# Patient Record
Sex: Female | Born: 1970 | Race: White | Hispanic: No | Marital: Married
Health system: Southern US, Community
[De-identification: ages and names within clinical notes are randomized; demographics above are authoritative.]

## PROBLEM LIST (undated history)

## (undated) DIAGNOSIS — E7800 Pure hypercholesterolemia, unspecified: Secondary | ICD-10-CM

## (undated) DIAGNOSIS — M5126 Other intervertebral disc displacement, lumbar region: Secondary | ICD-10-CM

## (undated) DIAGNOSIS — F419 Anxiety disorder, unspecified: Secondary | ICD-10-CM

## (undated) DIAGNOSIS — K219 Gastro-esophageal reflux disease without esophagitis: Secondary | ICD-10-CM

## (undated) HISTORY — DX: Gastro-esophageal reflux disease without esophagitis: K21.9

## (undated) HISTORY — PX: ABLATION: SHX5711

## (undated) HISTORY — DX: Other intervertebral disc displacement, lumbar region: M51.26

---

## 1998-02-18 ENCOUNTER — Encounter: Admission: RE | Admit: 1998-02-18 | Discharge: 1998-05-19 | Payer: Self-pay | Admitting: *Deleted

## 1999-07-03 ENCOUNTER — Encounter: Payer: Self-pay | Admitting: Obstetrics and Gynecology

## 1999-07-03 ENCOUNTER — Ambulatory Visit (HOSPITAL_COMMUNITY): Admission: RE | Admit: 1999-07-03 | Discharge: 1999-07-03 | Payer: Self-pay | Admitting: Obstetrics and Gynecology

## 1999-07-30 ENCOUNTER — Other Ambulatory Visit: Admission: RE | Admit: 1999-07-30 | Discharge: 1999-07-30 | Payer: Self-pay | Admitting: *Deleted

## 1999-10-01 ENCOUNTER — Encounter (INDEPENDENT_AMBULATORY_CARE_PROVIDER_SITE_OTHER): Payer: Self-pay | Admitting: Specialist

## 1999-10-01 ENCOUNTER — Other Ambulatory Visit: Admission: RE | Admit: 1999-10-01 | Discharge: 1999-10-01 | Payer: Self-pay | Admitting: *Deleted

## 1999-12-14 ENCOUNTER — Other Ambulatory Visit: Admission: RE | Admit: 1999-12-14 | Discharge: 1999-12-14 | Payer: Self-pay | Admitting: *Deleted

## 2000-03-05 ENCOUNTER — Inpatient Hospital Stay (HOSPITAL_COMMUNITY): Admission: AD | Admit: 2000-03-05 | Discharge: 2000-03-08 | Payer: Self-pay | Admitting: Obstetrics & Gynecology

## 2000-03-09 ENCOUNTER — Encounter: Admission: RE | Admit: 2000-03-09 | Discharge: 2000-03-25 | Payer: Self-pay | Admitting: *Deleted

## 2000-07-28 ENCOUNTER — Other Ambulatory Visit: Admission: RE | Admit: 2000-07-28 | Discharge: 2000-07-28 | Payer: Self-pay | Admitting: Obstetrics and Gynecology

## 2000-10-26 ENCOUNTER — Encounter (INDEPENDENT_AMBULATORY_CARE_PROVIDER_SITE_OTHER): Payer: Self-pay

## 2000-10-26 ENCOUNTER — Other Ambulatory Visit: Admission: RE | Admit: 2000-10-26 | Discharge: 2000-10-26 | Payer: Self-pay | Admitting: Obstetrics and Gynecology

## 2001-04-14 ENCOUNTER — Other Ambulatory Visit: Admission: RE | Admit: 2001-04-14 | Discharge: 2001-04-14 | Payer: Self-pay | Admitting: Otolaryngology

## 2001-04-14 ENCOUNTER — Encounter (INDEPENDENT_AMBULATORY_CARE_PROVIDER_SITE_OTHER): Payer: Self-pay | Admitting: Specialist

## 2001-12-11 ENCOUNTER — Other Ambulatory Visit: Admission: RE | Admit: 2001-12-11 | Discharge: 2001-12-11 | Payer: Self-pay | Admitting: Obstetrics and Gynecology

## 2003-05-08 ENCOUNTER — Other Ambulatory Visit: Admission: RE | Admit: 2003-05-08 | Discharge: 2003-05-08 | Payer: Self-pay | Admitting: Obstetrics and Gynecology

## 2003-05-10 ENCOUNTER — Encounter: Payer: Self-pay | Admitting: Obstetrics and Gynecology

## 2003-05-10 ENCOUNTER — Encounter: Admission: RE | Admit: 2003-05-10 | Discharge: 2003-05-10 | Payer: Self-pay | Admitting: Obstetrics and Gynecology

## 2003-10-22 ENCOUNTER — Encounter: Admission: RE | Admit: 2003-10-22 | Discharge: 2003-10-22 | Payer: Self-pay | Admitting: Internal Medicine

## 2004-04-09 ENCOUNTER — Inpatient Hospital Stay (HOSPITAL_COMMUNITY): Admission: AD | Admit: 2004-04-09 | Discharge: 2004-04-09 | Payer: Self-pay | Admitting: Obstetrics and Gynecology

## 2004-05-04 ENCOUNTER — Ambulatory Visit (HOSPITAL_COMMUNITY): Admission: RE | Admit: 2004-05-04 | Discharge: 2004-05-04 | Payer: Self-pay | Admitting: Obstetrics and Gynecology

## 2004-06-18 ENCOUNTER — Ambulatory Visit (HOSPITAL_COMMUNITY): Admission: RE | Admit: 2004-06-18 | Discharge: 2004-06-18 | Payer: Self-pay | Admitting: Obstetrics and Gynecology

## 2004-06-18 ENCOUNTER — Encounter (INDEPENDENT_AMBULATORY_CARE_PROVIDER_SITE_OTHER): Payer: Self-pay | Admitting: Specialist

## 2004-09-18 ENCOUNTER — Other Ambulatory Visit: Admission: RE | Admit: 2004-09-18 | Discharge: 2004-09-18 | Payer: Self-pay | Admitting: Obstetrics and Gynecology

## 2005-09-24 ENCOUNTER — Other Ambulatory Visit: Admission: RE | Admit: 2005-09-24 | Discharge: 2005-09-24 | Payer: Self-pay | Admitting: Obstetrics and Gynecology

## 2006-04-05 ENCOUNTER — Encounter (INDEPENDENT_AMBULATORY_CARE_PROVIDER_SITE_OTHER): Payer: Self-pay | Admitting: *Deleted

## 2006-04-05 ENCOUNTER — Inpatient Hospital Stay (HOSPITAL_COMMUNITY): Admission: RE | Admit: 2006-04-05 | Discharge: 2006-04-08 | Payer: Self-pay | Admitting: Obstetrics and Gynecology

## 2010-06-16 ENCOUNTER — Emergency Department (HOSPITAL_COMMUNITY): Admission: EM | Admit: 2010-06-16 | Discharge: 2010-06-16 | Payer: Self-pay | Admitting: Emergency Medicine

## 2010-09-08 ENCOUNTER — Ambulatory Visit (HOSPITAL_COMMUNITY)
Admission: RE | Admit: 2010-09-08 | Discharge: 2010-09-08 | Payer: Self-pay | Source: Home / Self Care | Attending: Obstetrics and Gynecology | Admitting: Obstetrics and Gynecology

## 2010-11-23 LAB — CBC
HCT: 37.1 % (ref 36.0–46.0)
MCH: 27.4 pg (ref 26.0–34.0)
MCHC: 32.1 g/dL (ref 30.0–36.0)
MCV: 85.3 fL (ref 78.0–100.0)
Platelets: 230 10*3/uL (ref 150–400)
RDW: 14.8 % (ref 11.5–15.5)

## 2010-11-23 LAB — PREGNANCY, URINE: Preg Test, Ur: NEGATIVE

## 2010-11-23 LAB — SURGICAL PCR SCREEN: MRSA, PCR: NEGATIVE

## 2011-01-29 NOTE — Op Note (Signed)
NAME:  Carol Pacheco, Carol Pacheco                 ACCOUNT NO.:  [id number]   MEDICAL RECORD NO.:  [id number]          PATIENT TYPE:  INP   LOCATION:  9141                          FACILITY:  [facility]   PHYSICIAN:  Zenaida Niece, M.D.DATE OF BIRTH:  [birth year]   DATE OF PROCEDURE:  04/05/2006  DATE OF DISCHARGE:                                 OPERATIVE REPORT   PREOPERATIVE DIAGNOSES:  1.  Intrauterine pregnancy at 39 weeks.  2.  Previous cesarean section.  3.  Desires surgical sterility.   POSTOPERATIVE DIAGNOSES:  1.  Intrauterine pregnancy at 39 weeks.  2.  Previous cesarean section.  3.  Desires surgical sterility.   PROCEDURES:  1.  Repeat low transverse cesarean section.  2.  Left partial salpingectomy.   SURGEON:  Zenaida Niece, MD.   ASSISTANT:  Tracey Harries, MD.   ANESTHESIA:  Spinal.   SPECIMENS:  Portion of left fallopian tube pathology.   ESTIMATED BLOOD LOSS:  800 mL.   COMPLICATIONS:  None.   FINDINGS:  The patient had normal anatomy with absence of right tube and  ovary and delivered a viable female infant with Apgars of 9 and 9 and weight 8  pounds 6 ounces.   PROCEDURE IN DETAIL:  The patient was taken to the operating room and placed  in the sitting position.  Dr. Tacy Dura instilled spinal anesthesia and she was  placed in the dorsal supine position with a left lateral tilt.  Abdomen was  then prepped and draped in the usual sterile fashion and a Foley catheter  was inserted.  The level of her anesthesia was found to be adequate and  abdomen was entered via her previous Pfannenstiel scar.  Once the peritoneal  cavity was entered, the peritoneum was stretched and a disposable self-  retaining retractor was placed.  A 4 cm transverse incision was then made in  the lower uterine segment.  Clear amniotic fluid was noted and the incision  was extended bilaterally digitally.  The fetal vertex was grasped and  delivered through the incision atraumatically.  A loose  nuchal cord x1  was  reduced.  The mouth and nares were suctioned.  The remainder of the infant  then delivered atraumatically.  The cord was doubly clamped and cut and the  infant handed to the awaiting pediatric team.  Cord blood was obtained and  the placenta delivered spontaneously and was sent for cord blood collection.  The uterus was wiped dry with a clean lap pad.  Uterine incision was  inspected and found to be free of extensions.  The cervix was dilated with a  long ring forceps through the uterus.  Uterine incision was repaired in one  layer, being a running locking layer with #1 chromic with adequate  hemostasis.  Bleeding from serosal edges was controlled with electrocautery.   Attention was turned to the left fallopian tube.  This was brought into the  operative field and the midportion of the tube was grasped with a Babcock  clamp.  A window was made in an avascular portion of the mesosalpinx  with  electrocautery.  A 0 plain gut suture was passed through this window and  tied proximally and then passed around distally to create a knuckle of tube  and retied.  The knuckle of tube was then removed sharply.  Both tubal ostia  were identified and the stump was hemostatic.  The left ovary was normal.  The right side was inspected and as previously noted, the right tube and  ovary were surgically absent.  The uterine incision was again inspected and  found to be hemostatic.  The subfascial space was then irrigated and made  hemostatic with electrocautery.  Rectus muscles were closed with running #1  chromic due to a fairly wide diastasis.  The fascia was then closed in a  running fashion starting at both ends and meeting in the middle with 0  Vicryl.  Subcutaneous tissue was then irrigated and made hemostatic with  electrocautery.  Skin was closed with staples, followed by a sterile  dressing.  The patient tolerated the procedure well and was taken to the  recovery room in stable  condition.  Counts were correct, and she received  Ancef 1 g after cord clamp.      Zenaida Niece, M.D.  Electronically Signed     TDM/MEDQ  D:  04/05/2006  T:  04/05/2006  Job:  [id number]

## 2011-01-29 NOTE — H&P (Signed)
NAME:  Carol Pacheco, Carol Pacheco                 ACCOUNT NO.:  [id number]   MEDICAL RECORD NO.:  [id number]          PATIENT TYPE:  AMB   LOCATION:  SDC                           [facility]ILITY:  [facility]   PHYSICIAN:  Zenaida Niece, M.D.DATE OF BIRTH:  28-Mar-1971   DATE OF ADMISSION:  06/18/2004  DATE OF DISCHARGE:                                HISTORY & PHYSICAL   CHIEF COMPLAINT:  Pelvic pain and right adnexal mass.   HISTORY OF PRESENT ILLNESS:  This is a 40 year old white female, gravida 2,  para 1-0-1-1 who was recently treated with methotrexate for an ectopic  pregnancy and had negative HCG on September 15.  However, on ultrasound exam  during the pregnancy she was found to have a right adnexal mass.  Ultrasound  here in the office on September 15 reveals an oblong cystic area of 5.3 x  2.3 x 3.6 cm which I feel is possibly a dilated right fallopian tube.  Due  to persistent pain and this persistent mass, the patient is being admitted  for laparoscopy and removal of this mass.   PAST OB HISTORY:  Cesarean section at 41 weeks for breech presentation, baby  weighed 8 pounds 6 ounces as well as the recent ectopic pregnancy.   PAST MEDICAL HISTORY:  Essentially negative.   PAST SURGICAL HISTORY:  Cesarean section.  She has had sinus surgery.   ALLERGIES:  PENICILLIN.   CURRENT MEDICATIONS:  Flonase and Claritin.   SOCIAL HISTORY:  The patient is married and denies significant alcohol,  tobacco or drug use.   REVIEW OF SYMPTOMS:  Otherwise negative.   FAMILY HISTORY:  Maternal grandmother with breast cancer.   PHYSICAL EXAMINATION:  GENERAL:  This is a well-developed, well-nourished,  white female in no acute distress.  NECK:  Supple without lymphadenopathy or thyromegaly.  LUNGS:  Clear to auscultation.  HEART:  Regular rate and rhythm without murmur.  ABDOMEN:  Soft, nontender, nondistended without palpable masses and she does  have a well healed transverse scar.  EXTREMITIES:  Have no  edema and are nontender.  PELVIC:  Uterus is normal size and she does have some fullness on the right  side and is slightly tender. The left side is normal.   ASSESSMENT:  Right adnexal mass. By ultrasound, this appears to be a  hydrosalpinx. This has persisted despite recent successful treatment for an  ectopic pregnancy. All options have been discussed with the patient and she  wishes to proceed with surgical evaluation and removal of this mass. The  risks of surgery including bleeding, infection and damage to the surrounding  organs have been discussed with the patient and she understands.   PLAN:  Admit the for a laparoscopy with possible removal of her right tube,  possible right salpingo-oophorectomy and possible ovarian cystectomy. This  all depends on what this mass is on direct examination. She also understands  there is a small possibility of requiring a laparotomy.     Todd   TDM/MEDQ  D:  06/17/2004  T:  06/17/2004  Job:  [id number]

## 2011-01-29 NOTE — Op Note (Signed)
[facility]  Patient:    Carol Pacheco, Carol Pacheco                        MRN: [id number] Proc. Date: 03/05/00 Adm. Date:  [id number] Attending:  Cleatrice Burke                           Operative Report  PREOPERATIVE DIAGNOSIS:       Breech.  Spontaneous rupture of membranes. Meconium stained fluid.  POSTOPERATIVE DIAGNOSIS:      Breech.  Spontaneous rupture of membranes. Meconium stained fluid.  Nuchal cord.  OPERATION:                    Low transverse cesarean section.  SURGEON:                      Cecilio Asper, M.D.  ASSISTANT:                    Erin Sons, C.N.M.  ANESTHESIA:                   Epidural.  ESTIMATED BLOOD LOSS:         600 cc.  URINE OUTPUT:                 150 cc.  FINDINGS:                     A viable female infant named Denny Peon, Apgars 9 and 9,  weighing 8 pounds 6 ounces.  Normal tubes and ovaries.  COMPLICATIONS:                None.  INDICATIONS:                  The patient is a 40 year old, gravida 1, para 0, ho presented at 41-2/7 weeks estimated gestational age with spontaneous rupture of  membranes at approximately 2 a.m.  The patient reported fetal movement.  She denied vaginal bleeding.  Her cervical examination was 2 to 3 cm, 90% effaced, with a bulging bag of water, and bloody show.  The patient was noted to be positive Group B Strep.  It was felt that the presenting part was too high in the pelvis to release the rest of the amniotic fluid.  At approximately 3:40 the membranes were needled.  Breech presentation was then confirmed by manual palpation as well as  ultrasound.  The patient was therefore consented for cesarean delivery.  DESCRIPTION OF PROCEDURE:     After an adequate level of epidural anesthesia was obtained, the patient was prepped and draped in a sterile fashion.  A Foley had  previously been placed in the bladder to drain it of urine.  A low transverse incision was made  down through the subcutaneous fat to the fascia.  The fascia as incised on either side of the midline and this incision was carried laterally in both directions with the Mayo scissors.  Kocher clamps were placed in the superior edge of the incision and the fascia was removed from the rectus muscles both superiorly and inferiorly.  The parietoperitoneum was grasped with hemostats and incised with the Metzenbaum scissors.  This incision was carried superiorly and  inferiorly taking care not to damage bowel or bladder.  Balfour bladder retractor was placed inside of the incision.  The visceroperitoneum of  the lower uterine segment was elevated and incised in order to develop a bladder flap.  The bladder piece was placed inside of this flap.  The uterus was entered in a low transverse cesarean fashion and extended laterally in both directions with the bandage scissors.  The breech was elevated and delivered using Panard maneuver and standard breech extraction technique.  There was a nuchal cord x 2.  The infant was DeLee suctioned and bulb suctioned on the operative field.  The cord was doubly clamped and cut and the infant was taken to the warmer where he was cared for by the pediatric team.  Cord blood was obtained.  The placenta was manually extracted.  The uterus was wiped clean with a wet lap sponge.  The uterine incision was grasped with ring clamps.  It was reapproximated with a running suture of #0 Vicryl.  A  second imbricating layer was used with good hemostatic result.  The pelvis was copiously irrigated.  The tubes and ovaries were noted to be normal.  The peritoneum was then reapproximated manually.  The fascia was reapproximated from the lateral edge to the midline with a running suture of #1 Vicryl.  The skin was closed with staples.  Bandage was applied.  Sponge, needle, and instrument counts were correct x 2.  The patient tolerated the procedure well.  She was  taken to he recovery room in stable condition. DD:  03/05/00 TD:  03/07/00 Job: [id number]

## 2011-01-29 NOTE — H&P (Signed)
NAME:  Carol Pacheco, GINTZ                 ACCOUNT NO.:  [id number]   MEDICAL RECORD NO.:  [id number]          PATIENT TYPE:  INP   LOCATION:  NA                            FACILITY:  [facility]   PHYSICIAN:  Zenaida Niece, M.D.DATE OF BIRTH:  [birth year]   DATE OF ADMISSION:  04/05/2006  DATE OF DISCHARGE:                                HISTORY & PHYSICAL   CHIEF COMPLAINT:  Repeat cesarean section.   HISTORY OF PRESENT ILLNESS:  This is a 40 year old white female gravida 5,  para 1-0-3-1, with an EGA of [redacted] weeks by an LMP consistent with an 8 week  ultrasound with a due date of April 12, 2006, who presents for repeat  cesarean section. Prenatal care complicated by the fact that she has had a  prior cesarean section but declines trial of labor and wishes to undergo  repeat cesarean section. Prenatal care also complicated by the fact that she  was treated with Zithromax for presumed sinusitis at approximately 22 weeks.  She was treated with pain medicine and muscle relaxers at 24 weeks for right  shoulder pain and also got massage for this. She has had a previous removal  of her right fallopian tube and ovary for ectopic pregnancy with persistent  adnexal mass and wishes to undergo ligation of the left tube for permanent  sterility. Prenatal care was otherwise uncomplicated.   PRENATAL LABORATORY DATA:  Blood type is A positive with a negative antibody  screen. RPR non-reactive, Rubella immune, hepatitis B surface antigen  negative. Gonorrhea and Chlamydia was negative. Triple screen is declined.  One hour Glucola is 120 and group B strep is negative.   PAST OBSTETRIC HISTORY:  In 2001, she had a cesarean section for a breech  presentation. The baby weighed 8 pounds 6 ounces. In 2005, she had a  laparoscopic right salpingectomy for an ectopic pregnancy. In 2005 and 2006,  she had spontaneous abortions.   PAST GYNECOLOGIC HISTORY:  History of condylomata and a history of abnormal  pap smears  with most recent pap smears being negative.   PAST MEDICAL HISTORY:  Significant for history of depression.   PAST SURGICAL HISTORY:  Cesarean section and laparoscopy with right  salpingooophorectomy. She has also had nose surgery.   ALLERGIES:  PENICILLIN (causes a rash.)   CURRENT MEDICATIONS:  Darvocet and Flexeril p.r.n.   SOCIAL HISTORY:  The patient is married and denies alcohol, tobacco, or drug  use.   FAMILY HISTORY:  Noncontributory.   PHYSICAL EXAMINATION:  VITAL SIGNS:  Weight 173 pounds. Blood pressure  110/70.  GENERAL:  This is a gravid female in no acute distress.  NECK:  Supple without lymphadenopathy or thyromegaly.  LUNGS:  Clear to auscultation.  HEART:  Regular rate and rhythm. Without murmur.  ABDOMEN:  Gravid, nontender with a fundal height of 39.5 cm and she has a  well healed transverse scar.  EXTREMITIES:  Have 1+ edema and are non-tender.  GENITOURINARY:  Cervix is 130, minus 3, and vertex presentation.   ASSESSMENT:  1.  Intrauterine pregnancy at 39 weeks.  2.  Previous cesarean section. The patient declines trial of labor and      wishes to undergo repeat c-section. All risks of surgery have been      discussed and she understands.  3.  Desires surgical sterility. The patient has a prior right salpingectomy      for an adnexal mass after an ectopic pregnancy and wishes to undergo      ligation of her left fallopian tube. She understands this will result in      permanent sterility with an approximately 1 in 150 failure rate.   PLAN:  Admit the patient on the day of surgery for repeat cesarean section  and ligation of her left fallopian tube.      Zenaida Niece, M.D.  Electronically Signed     TDM/MEDQ  D:  04/04/2006  T:  04/04/2006  Job:  [id number]

## 2011-01-29 NOTE — Op Note (Signed)
NAME:  Carol Pacheco, Carol Pacheco                 ACCOUNT NO.:  [id number]   MEDICAL RECORD NO.:  [id number]          PATIENT TYPE:  AMB   LOCATION:  SDC                           [facility]ACILITY:  [facility]   PHYSICIAN:  Zenaida Niece, M.D.DATE OF BIRTH:  Nov 10, 1970   DATE OF PROCEDURE:  06/18/2004  DATE OF DISCHARGE:                                 OPERATIVE REPORT   PREOPERATIVE DIAGNOSIS:  Right adnexal mass.   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   PROCEDURE:  Laparoscopic right salpingo-oophorectomy.   SURGEON:  Zenaida Niece, M.D.   ANESTHESIA:  General endotracheal tube.   ESTIMATED BLOOD LOSS:  50 cc.   FINDINGS:  She had an enlarged right ovary with a probable right  endometrioma, and this was adherent to the pelvic sidewall and the sigmoid  colon.  The right tube was clubbed and adherent to the ovary.  There  appeared to be diffuse brown endometrial implants over most of the pelvic  peritoneum.  She had a normal appendix.  The uterus was otherwise normal,  and she had a normal left tube and ovary.   DESCRIPTION OF PROCEDURE:  The patient was taken to the operating room and  placed in the dorsal supine position.  General anesthesia was induced, and  she was placed in mobile stirrups.  The abdomen was prepped and draped in  the usual sterile fashion.  A Foley catheter was placed.  A Hulka tenaculum  was applied to the cervix for uterine manipulation.  The infraumbilical skin  was then infiltrated with 0.25% Marcaine.   A 2-cm horizontal incision was made.  The Veress needle was inserted into  the peritoneal cavity, and placement was confirmed by the water drop test  and an opening pressure of 2 mmHg.  CO2 gas was then insufflated to a  pressure of 12 mmHg, and the Veress needle was removed.  The 10/11  disposable trocar was then introduced and placement confirmed by the  laparoscope.  A 5-mm port was placed low in the midline under direct  visualization.  Inspection revealed the above-mentioned findings.  Some of  the adhesions of the ovary to the pelvic sidewall were taken down with blunt  dissection.  Due to the significantly enlarged ovary and adhesions and what  appeared to be a nonfunctional tube, I elected to proceed with right  salpingo-oophorectomy.  Using bipolar cautery, the right infundibulopelvic  ligament was coagulated and then cut.  The mesosalpinx and proximal  fallopian tube were then also coagulated with bipolar cautery and cut.  This  achieved mobility for the superior portion of this mass.  The remainder of  the ovary was able to be dissected off of the pelvic sidewall and off the  colon with blunt dissection.  Some bleeding from the uterine side of the  utero-ovarian ligament was coagulated with bipolar cautery to achieve  hemostasis.  The area was copiously irrigated and found to be hemostatic.  Again, there appeared to be diffuse brown peritoneal implants consistent  with endometriosis.  This was too diffuse to warrant  fulguration.  The left  tube and ovary appeared essentially normal.  The 5-mm scope was then  inserted through the 5-mm port, and the EndoCatch bag was placed through the  umbilical port.  The ovarian mass was placed in this, and it was brought to  the umbilical incision.  Scissors were used to extend the fascial incision  bilaterally, and the bag was able to be removed intact.  The 5-mm port was  then removed.  The umbilical fascia was closed with running 0 Vicryl.  The  skin incisions were closed with interrupted subcuticular sutures of 4-0  Vicryl followed by Steri-Strips and Band-Aids.  The Hulka tenaculum and  Foley catheter were then removed.   The patient was awakened in the operating room.  She tolerated the procedure  well and was taken to the recovery room in stable condition.  Counts were  correct x2.  She had PAS hose on throughout the procedure.     Todd   TDM/MEDQ  D:   06/18/2004  T:  06/18/2004  Job:  [id number]
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
## 2011-01-29 NOTE — Discharge Summary (Signed)
[facility]  Patient:    Carol Pacheco, Carol Pacheco                        MRN: [id number] Adm. Date:  [id number] Disc. Date: 03/08/00 Attending:  Cleatrice Burke DictatorVance Gather Duplantis, C.N.M.                           Discharge Summary  ADMISSION DIAGNOSES:          1. Intrauterine pregnancy at 41-2/7 weeks.                               2. Early labor.                               3. Positive group B strep.  DISCHARGE DIAGNOSES:          1. Intrauterine pregnancy at 41-2/7 weeks.                               2. Early labor.                               3. Positive group B strep.                               4. Breech presentation and primary low                                  transverse cesarean section for delivery.                               6. Breast and bottle feeding.                               7. Undecided regarding contraception.  PROCEDURES:                   Primary low transverse cesarean section for breech presentation on March 05, 2000, by Dr. Cleatrice Burke for delivery of a viable female infant, named Denny Peon, who had Apgars of 9 and 9 and weighed 6 pounds 6 ounces.  HOSPITAL COURSE:              Ms. Ayars is a 40 year old married white female, gravida 1, para 0, who presents at 41-2/7 weeks who presented complaining of contractions every two to four minutes since early in the morning.  She denied any leaking or vaginal bleeding and was admitted in early labor.  Later throughout progress of the day, she was found to be breech presentation and was delivered by primary low transverse cesarean section by Dr. Cleatrice Burke without complications.  Please see her operative note for details.  She delivered a viable female infant, named Denny Peon, who had Apgars 9 and 9 and weighed 6 pounds 6 ounces.  Her postoperative course was uneventful.  She is afebrile and her vital signs are stable.  She is ambulating, voiding and  eating without  difficulty.  She is breast and bottle feeding also without difficulty.  She desires oral contraceptives at six weeks for birth control.  She is deemed ready for discharge today.  DISCHARGE INSTRUCTIONS:       As per [facility] OB/[facility]N handout.  DISCHARGE MEDICATIONS:        Motrin 600 mg p.o. q.6h. p.r.n. for pain, Tylenol #3 one to two p.o. q.4-6h. p.r.n. for pain and prenatal vitamins one p.o. q.d.  DISCHARGE LABORATORY DATA:    Her hemoglobin 9.5, WBC 15.4, platelets 172. DISCHARGE FOLLOWUP:           Her discharge followup will be in six weeks at [facility] or p.r.n. DD:  03/08/00 TD:  03/08/00 Job: [id number]

## 2011-01-29 NOTE — Discharge Summary (Signed)
NAME:  Carol Pacheco, Carol Pacheco                 ACCOUNT NO.:  [id number]   MEDICAL RECORD NO.:  [id number]          PATIENT TYPE:  INP   LOCATION:  9141                          FACILITY:  [facility]   PHYSICIAN:  Zenaida Niece, M.D.DATE OF BIRTH:  [birth year]   DATE OF ADMISSION:  04/05/2006  DATE OF DISCHARGE:                                 DISCHARGE SUMMARY   ADMISSION DIAGNOSES:  1.  Intrauterine pregnancy at 39 weeks.  2.  Previous cesarean section.  3.  Desires surgical sterility.   DISCHARGE DIAGNOSES:  1.  Intrauterine pregnancy at 39 weeks.  2.  Previous cesarean section.  3.  Desires surgical sterility.   PROCEDURES:  On April 05, 2006, she had a repeat low transverse cesarean  section and a left partial salpingectomy.   HISTORY AND PHYSICAL:  Please see chart for full history and physical.  Briefly, this is a 40 year old white female gravida 5, para 1-0-3-1 with an  EGA of [redacted] weeks who is admitted for repeat cesarean section.  She is cleared  for a trial of labor, but declines this.  She has previously had a right  salpingo-oophorectomy for ectopic pregnancy and persistent mass.   PAST OBSTETRICAL HISTORY:  1.  One cesarean section for breech presentation.  2.  Ectopic pregnancy.  3.  Two spontaneous abortions.  The remainder of her history is essentially      noncontributory.   PHYSICAL EXAMINATION:  Significant for a gravid abdomen with a fundal height  of 39.5 cm and a well healed transverse scar and cervix is 1, 30, -3 with a  vertex presentation.   HOSPITAL COURSE:  The patient was admitted on the day of surgery and  underwent repeat low transverse cesarean section and left partial  salpingectomy without complications.  Estimated blood loss 800 mL and she  delivered a viable female infant with Apgars of 9 and 9 that weighed 8 pounds  6 ounces.  Postoperatively she had no significant complications.  Preoperative hemoglobin 11.6, postoperative 9.3.   On postoperative #3 she  was felt to be stable enough for discharge home.  Prior to discharge her staples were to be removed and Steri-Strips applied.   DISCHARGE INSTRUCTIONS:  Regular diet, pelvic rest and no strenuous  activity.  Follow-up is in 2 weeks.   MEDICATIONS:  1.  Percocet #40 one to two p.o. q.4-6h. p.r.n. pain.  2.  Over-the-counter ibuprofen as needed.   She is given our discharge pamphlet.      Zenaida Niece, M.D.  Electronically Signed     TDM/MEDQ  D:  04/08/2006  T:  04/08/2006  Job:  [id number]

## 2011-04-07 ENCOUNTER — Ambulatory Visit
Admission: RE | Admit: 2011-04-07 | Discharge: 2011-04-07 | Disposition: A | Discharge status: Home / Self Care | Payer: 59 | Source: Ambulatory Visit | Attending: Family Medicine | Admitting: Family Medicine

## 2011-04-07 ENCOUNTER — Other Ambulatory Visit: Payer: Self-pay | Admitting: Family Medicine

## 2011-04-07 DIAGNOSIS — N9489 Other specified conditions associated with female genital organs and menstrual cycle: Secondary | ICD-10-CM

## 2011-04-07 DIAGNOSIS — R109 Unspecified abdominal pain: Secondary | ICD-10-CM

## 2011-04-07 MED ORDER — IOHEXOL 300 MG/ML  SOLN
100.0000 mL | Freq: Once | INTRAMUSCULAR | Status: AC | PRN
  Administered 2011-04-07: 100 mL via INTRAVENOUS

## 2012-03-22 ENCOUNTER — Other Ambulatory Visit: Payer: Self-pay | Admitting: Family Medicine

## 2012-03-22 DIAGNOSIS — R911 Solitary pulmonary nodule: Secondary | ICD-10-CM

## 2012-05-19 ENCOUNTER — Other Ambulatory Visit: Payer: 59

## 2013-03-30 ENCOUNTER — Other Ambulatory Visit: Payer: Self-pay

## 2013-03-30 DIAGNOSIS — Z1231 Encounter for screening mammogram for malignant neoplasm of breast: Secondary | ICD-10-CM

## 2013-05-01 ENCOUNTER — Ambulatory Visit: Payer: 59

## 2013-05-22 ENCOUNTER — Ambulatory Visit
Admission: RE | Admit: 2013-05-22 | Discharge: 2013-05-22 | Disposition: A | Discharge status: Home / Self Care | Payer: 59 | Source: Ambulatory Visit

## 2013-05-22 DIAGNOSIS — Z1231 Encounter for screening mammogram for malignant neoplasm of breast: Secondary | ICD-10-CM

## 2013-05-23 ENCOUNTER — Other Ambulatory Visit: Payer: Self-pay | Admitting: Obstetrics and Gynecology

## 2013-05-23 DIAGNOSIS — R928 Other abnormal and inconclusive findings on diagnostic imaging of breast: Secondary | ICD-10-CM

## 2013-06-07 ENCOUNTER — Ambulatory Visit
Admission: RE | Admit: 2013-06-07 | Discharge: 2013-06-07 | Disposition: A | Discharge status: Home / Self Care | Payer: 59 | Source: Ambulatory Visit | Attending: Obstetrics and Gynecology | Admitting: Obstetrics and Gynecology

## 2013-06-07 DIAGNOSIS — R928 Other abnormal and inconclusive findings on diagnostic imaging of breast: Secondary | ICD-10-CM

## 2013-07-23 ENCOUNTER — Other Ambulatory Visit: Payer: Self-pay | Admitting: Family Medicine

## 2013-07-23 ENCOUNTER — Ambulatory Visit
Admission: RE | Admit: 2013-07-23 | Discharge: 2013-07-23 | Disposition: A | Discharge status: Home / Self Care | Payer: 59 | Source: Ambulatory Visit | Attending: Family Medicine | Admitting: Family Medicine

## 2013-07-23 DIAGNOSIS — R1031 Right lower quadrant pain: Secondary | ICD-10-CM

## 2013-07-24 ENCOUNTER — Other Ambulatory Visit: Payer: Self-pay | Admitting: Family Medicine

## 2013-07-24 DIAGNOSIS — R935 Abnormal findings on diagnostic imaging of other abdominal regions, including retroperitoneum: Secondary | ICD-10-CM

## 2013-07-28 ENCOUNTER — Other Ambulatory Visit: Payer: 59

## 2013-08-02 ENCOUNTER — Ambulatory Visit
Admission: RE | Admit: 2013-08-02 | Discharge: 2013-08-02 | Disposition: A | Discharge status: Home / Self Care | Payer: 59 | Source: Ambulatory Visit | Attending: Family Medicine | Admitting: Family Medicine

## 2013-08-02 DIAGNOSIS — R935 Abnormal findings on diagnostic imaging of other abdominal regions, including retroperitoneum: Secondary | ICD-10-CM

## 2013-08-02 MED ORDER — GADOXETATE DISODIUM 0.25 MMOL/ML IV SOLN
7.0000 mL | Freq: Once | INTRAVENOUS | Status: AC | PRN
  Administered 2013-08-02: 7 mL via INTRAVENOUS

## 2014-05-27 ENCOUNTER — Other Ambulatory Visit (HOSPITAL_COMMUNITY): Payer: Self-pay | Admitting: Sports Medicine

## 2014-05-27 DIAGNOSIS — M545 Low back pain, unspecified: Secondary | ICD-10-CM

## 2014-05-27 DIAGNOSIS — M79605 Pain in left leg: Principal | ICD-10-CM

## 2014-05-30 ENCOUNTER — Ambulatory Visit
Admission: RE | Admit: 2014-05-30 | Discharge: 2014-05-30 | Disposition: A | Discharge status: Home / Self Care | Payer: 59 | Source: Ambulatory Visit | Attending: Sports Medicine | Admitting: Sports Medicine

## 2014-05-30 ENCOUNTER — Ambulatory Visit (HOSPITAL_COMMUNITY): Payer: 59

## 2014-05-30 DIAGNOSIS — M79605 Pain in left leg: Principal | ICD-10-CM

## 2014-05-30 DIAGNOSIS — M545 Low back pain, unspecified: Secondary | ICD-10-CM

## 2014-06-03 ENCOUNTER — Other Ambulatory Visit: Payer: Self-pay

## 2014-06-03 DIAGNOSIS — Z1231 Encounter for screening mammogram for malignant neoplasm of breast: Secondary | ICD-10-CM

## 2014-06-05 ENCOUNTER — Other Ambulatory Visit: Payer: 59

## 2014-06-18 ENCOUNTER — Ambulatory Visit
Admission: RE | Admit: 2014-06-18 | Discharge: 2014-06-18 | Disposition: A | Discharge status: Home / Self Care | Payer: 59 | Source: Ambulatory Visit

## 2014-06-18 ENCOUNTER — Encounter (INDEPENDENT_AMBULATORY_CARE_PROVIDER_SITE_OTHER): Payer: Self-pay

## 2014-06-18 DIAGNOSIS — Z1231 Encounter for screening mammogram for malignant neoplasm of breast: Secondary | ICD-10-CM

## 2015-07-01 ENCOUNTER — Other Ambulatory Visit: Payer: Self-pay

## 2015-07-01 DIAGNOSIS — Z1231 Encounter for screening mammogram for malignant neoplasm of breast: Secondary | ICD-10-CM

## 2015-07-25 ENCOUNTER — Ambulatory Visit
Admission: RE | Admit: 2015-07-25 | Discharge: 2015-07-25 | Disposition: A | Discharge status: Home / Self Care | Payer: Managed Care, Other (non HMO) | Source: Ambulatory Visit

## 2015-07-25 DIAGNOSIS — Z1231 Encounter for screening mammogram for malignant neoplasm of breast: Secondary | ICD-10-CM

## 2016-07-07 ENCOUNTER — Other Ambulatory Visit: Payer: Self-pay | Admitting: Obstetrics and Gynecology

## 2016-07-07 DIAGNOSIS — Z1231 Encounter for screening mammogram for malignant neoplasm of breast: Secondary | ICD-10-CM

## 2016-07-29 ENCOUNTER — Ambulatory Visit
Admission: RE | Admit: 2016-07-29 | Discharge: 2016-07-29 | Disposition: A | Discharge status: Home / Self Care | Payer: Managed Care, Other (non HMO) | Source: Ambulatory Visit | Attending: Obstetrics and Gynecology | Admitting: Obstetrics and Gynecology

## 2016-07-29 DIAGNOSIS — Z1231 Encounter for screening mammogram for malignant neoplasm of breast: Secondary | ICD-10-CM

## 2017-03-27 IMAGING — MG 2D DIGITAL SCREENING BILATERAL MAMMOGRAM WITH CAD AND ADJUNCT TO
5 series · 6 of 9 positions shown · non-contrast
Comparison: Previous exam(s).

CLINICAL DATA: Screening.

EXAM:
2D DIGITAL SCREENING BILATERAL MAMMOGRAM WITH CAD AND ADJUNCT TOMO

[R XCCL]
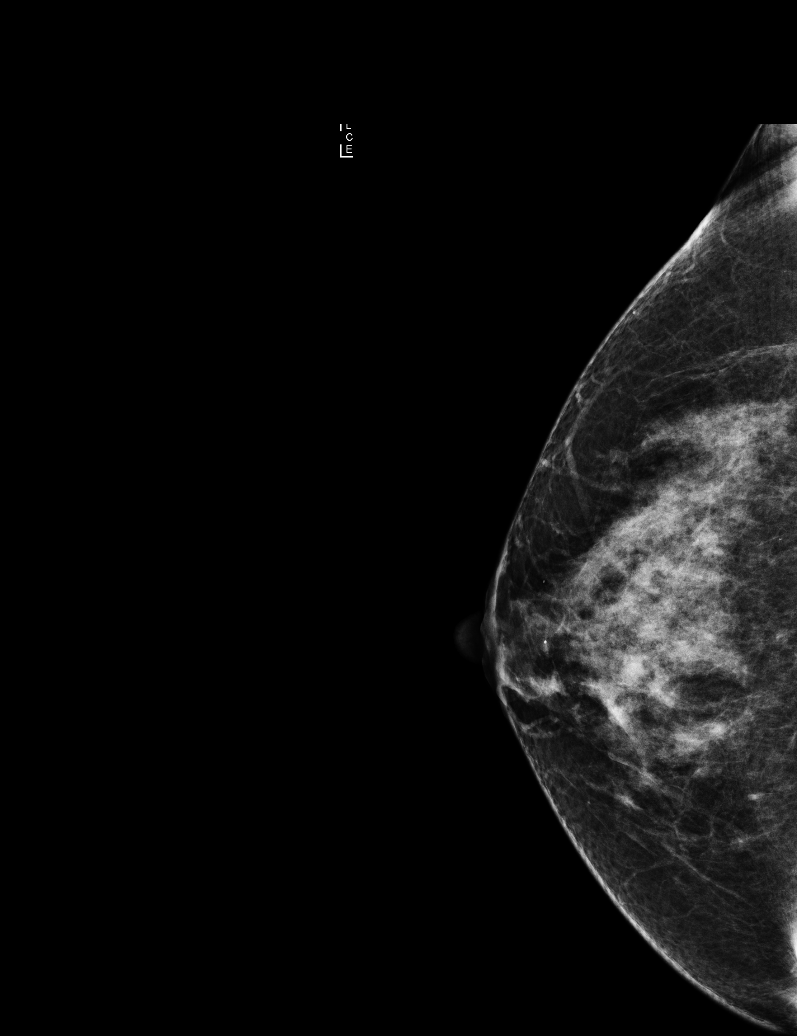

[R CC]
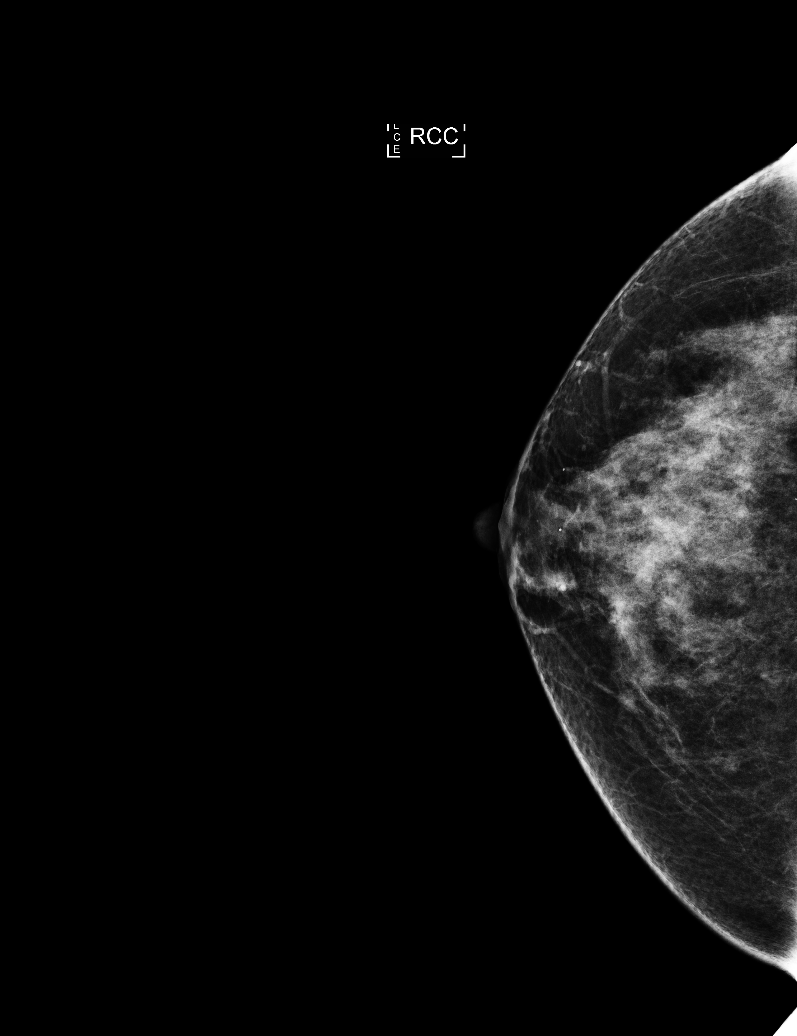

[R MLO synth-2D]
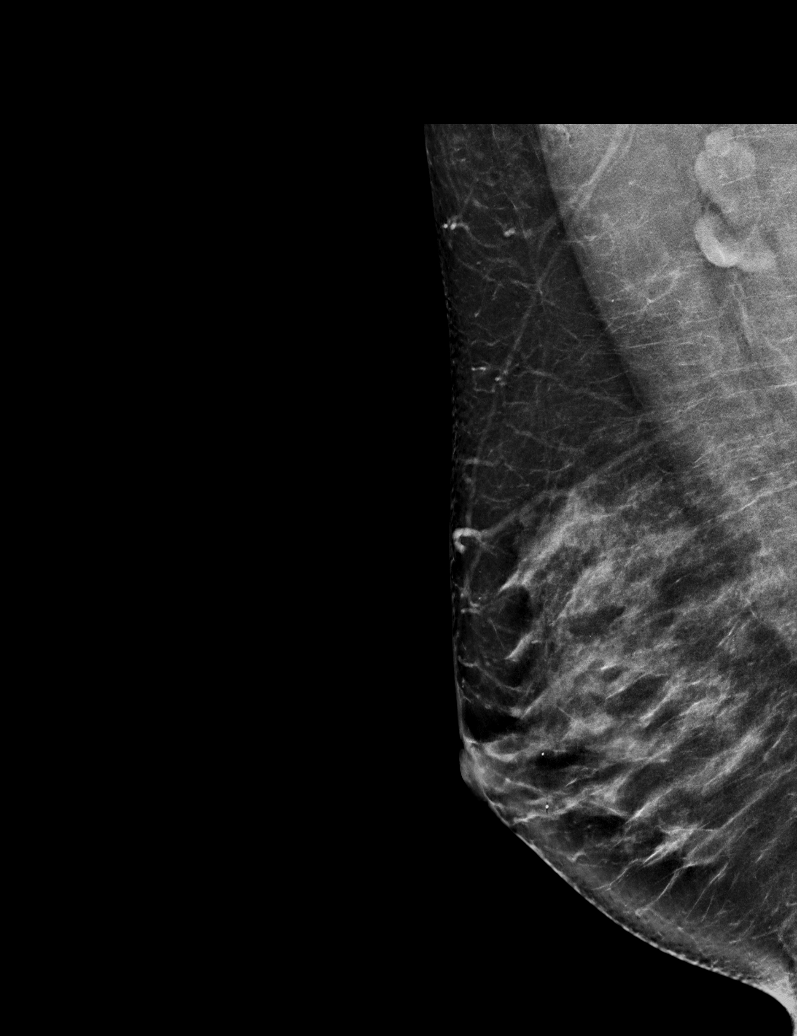

[R CC synth-2D]
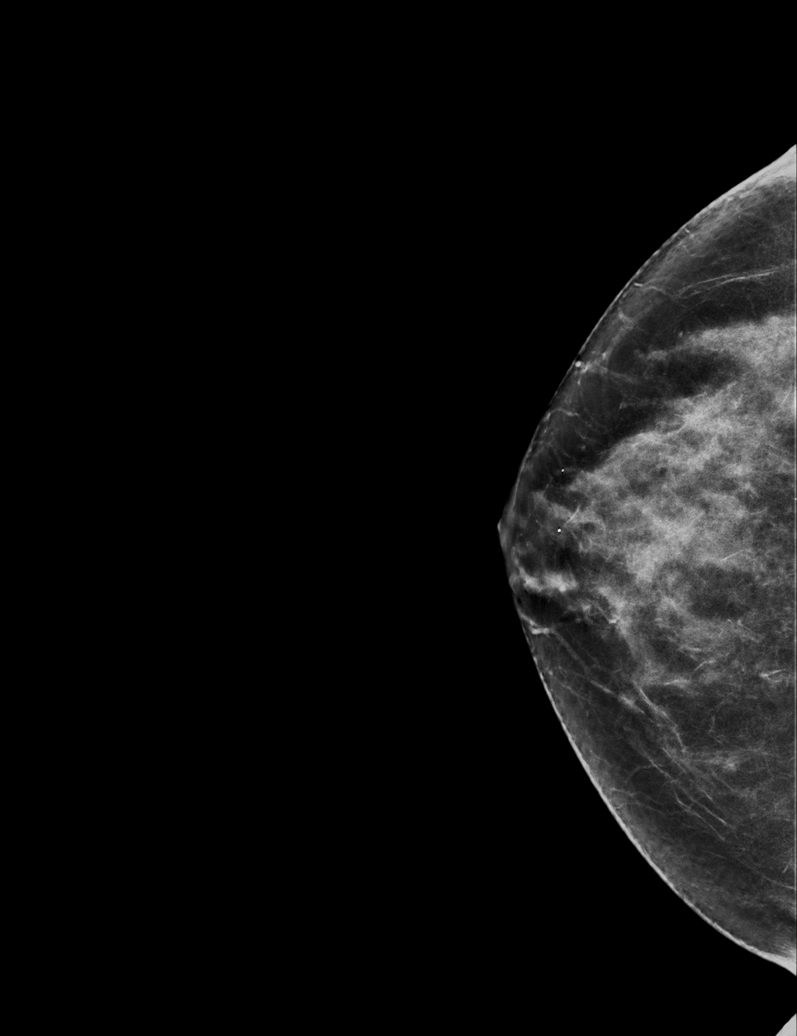

[L MLO tomo · 2 of 76 frames shown]
[frame 25/76]
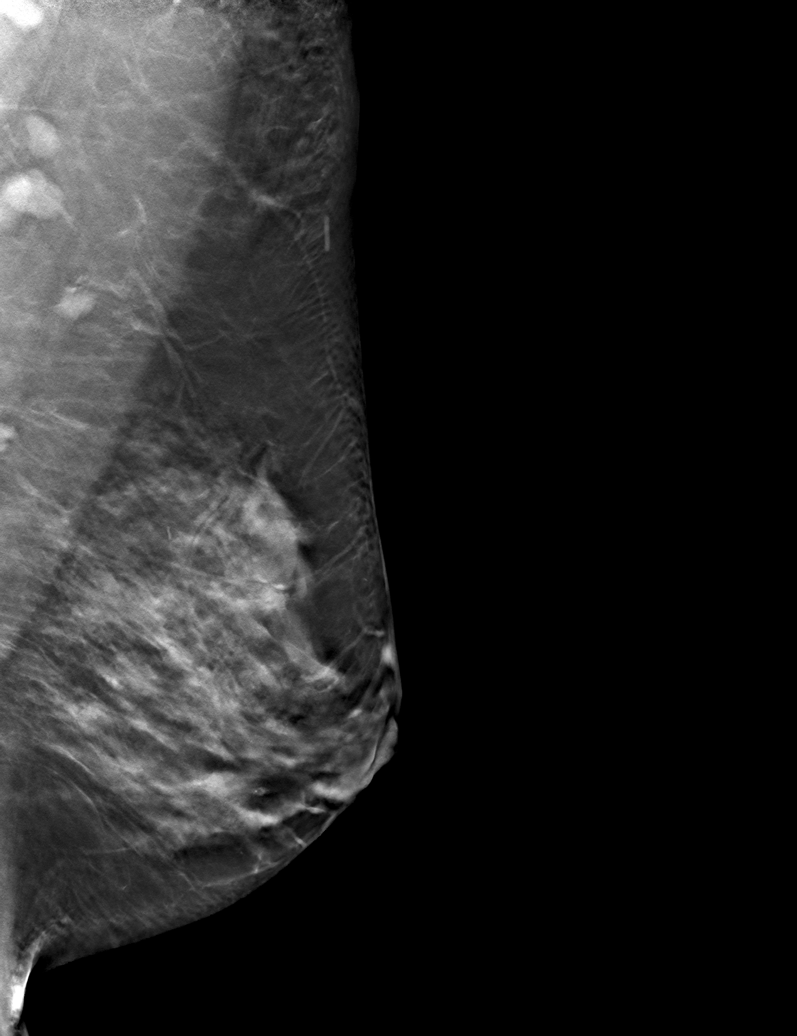
[frame 39/76]
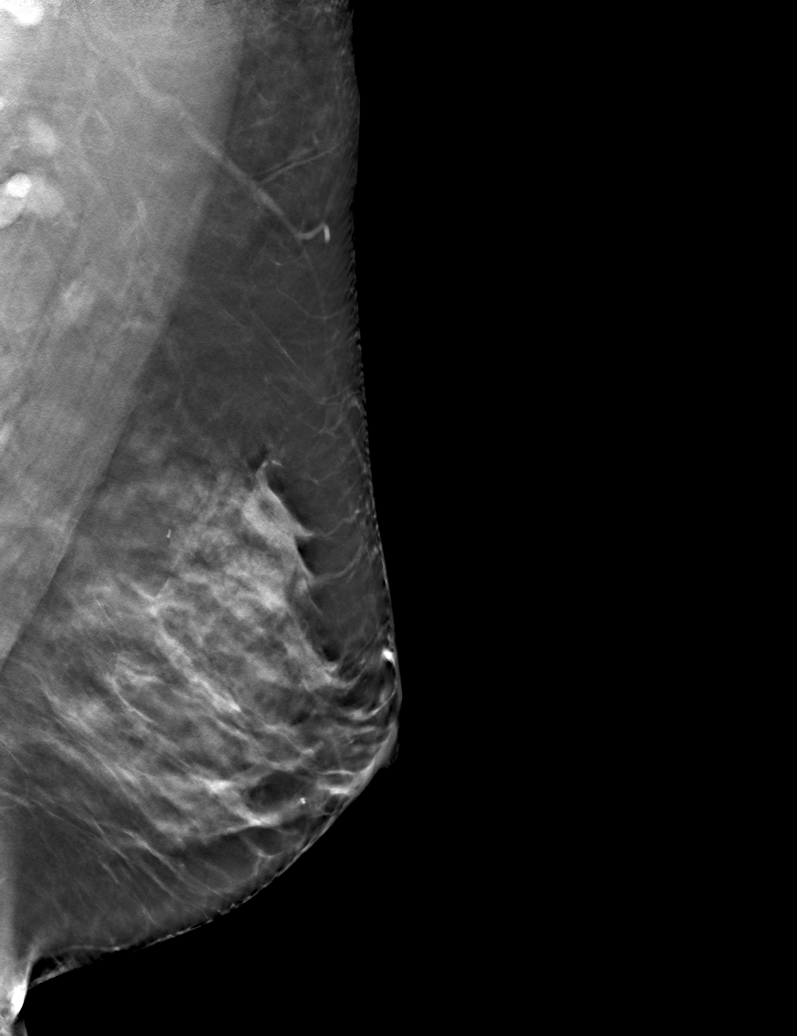

[6 of 9 positions shown; findings below may reference images not displayed]

ACR Breast Density Category c: The breast tissue is heterogeneously
dense, which may obscure small masses.
FINDINGS: There are no findings suspicious for malignancy. Images were
processed with CAD.
IMPRESSION: No mammographic evidence of malignancy. A result letter of this
screening mammogram will be mailed directly to the patient.

RECOMMENDATION:
Screening mammogram in one year. (Code:[id number])

BI-RADS CATEGORY  1: Negative.

## 2017-10-03 ENCOUNTER — Ambulatory Visit
Admission: RE | Admit: 2017-10-03 | Discharge: 2017-10-03 | Disposition: A | Discharge status: Home / Self Care | Payer: Managed Care, Other (non HMO) | Source: Ambulatory Visit | Attending: Obstetrics and Gynecology | Admitting: Obstetrics and Gynecology

## 2017-10-03 ENCOUNTER — Other Ambulatory Visit: Payer: Self-pay | Admitting: Obstetrics and Gynecology

## 2017-10-03 DIAGNOSIS — Z1231 Encounter for screening mammogram for malignant neoplasm of breast: Secondary | ICD-10-CM

## 2019-11-18 ENCOUNTER — Ambulatory Visit: Payer: BC Managed Care – PPO | Attending: Internal Medicine

## 2019-11-18 DIAGNOSIS — Z23 Encounter for immunization: Secondary | ICD-10-CM | POA: Insufficient documentation

## 2019-11-18 NOTE — Progress Notes (Signed)
   Covid-19 Vaccination Clinic  Name:  Carol Pacheco    MRN: [id number] DOB: 1971/06/21  11/18/2019  [name] was observed post Covid-19 immunization for 15 minutes without incident. She was provided with Vaccine Information Sheet and instruction to access the V-Safe system.   [name] was instructed to call 911 with any severe reactions post vaccine: Marland Kitchen Difficulty breathing  . Swelling of face and throat  . A fast heartbeat  . A bad rash all over body  . Dizziness and weakness   Immunizations Administered    Name Date Dose VIS Date Route   Pfizer COVID-19 Vaccine 11/18/2019  8:26 AM 0.3 mL 08/24/2019 Intramuscular   Manufacturer: [employer], [employer]   Lot: [id number]   NDC: [address]-2

## 2019-12-09 ENCOUNTER — Ambulatory Visit: Payer: BC Managed Care – PPO | Attending: Internal Medicine

## 2019-12-09 ENCOUNTER — Other Ambulatory Visit: Payer: Self-pay

## 2019-12-09 DIAGNOSIS — Z23 Encounter for immunization: Secondary | ICD-10-CM

## 2019-12-09 NOTE — Progress Notes (Signed)
   Covid-19 Vaccination Clinic  Name:  Carol Pacheco    MRN: [id number] DOB: Dec 06, 1970  12/09/2019  Carol Pacheco was observed post Covid-19 immunization for 30 minutes based on pre-vaccination screening .  During the observation period, she experienced an adverse reaction with the following symptoms:  hot and tingly all over.  Assessment : Time of assessment 0845. Alert and oriented.  Actions taken:  Vitals sign taken   BP elevated on initial assessment and then returned to normal. Medications administered: No medication administered.  Disposition: Reports no further symptoms of adverse reaction after observation for 30 minutes. Discharged home. Instructed to call 911 for trouble breathing, rapid heart rate, dizziness, swelling of tongue or throat.   Immunizations Administered    Name Date Dose VIS Date Route   Pfizer COVID-19 Vaccine 12/09/2019  8:28 AM 0.3 mL 08/24/2019 Intramuscular   Manufacturer: [employer], [employer]   Lot: [id number]   NDC: [address]-4

## 2020-06-03 ENCOUNTER — Other Ambulatory Visit: Payer: Self-pay | Admitting: Critical Care Medicine

## 2020-06-03 ENCOUNTER — Other Ambulatory Visit: Payer: BC Managed Care – PPO

## 2020-06-03 DIAGNOSIS — Z20822 Contact with and (suspected) exposure to covid-19: Secondary | ICD-10-CM

## 2020-06-05 LAB — SARS-COV-2, NAA 2 DAY TAT

## 2020-06-05 LAB — NOVEL CORONAVIRUS, NAA: SARS-CoV-2, NAA: DETECTED — AB

## 2020-06-05 LAB — SPECIMEN STATUS REPORT

## 2020-10-14 ENCOUNTER — Encounter (HOSPITAL_COMMUNITY): Payer: Self-pay | Admitting: Emergency Medicine

## 2020-10-14 ENCOUNTER — Other Ambulatory Visit: Payer: Self-pay

## 2020-10-14 ENCOUNTER — Emergency Department (HOSPITAL_COMMUNITY)
Admission: EM | Admit: 2020-10-14 | Discharge: 2020-10-14 | Disposition: A | Discharge status: Home / Self Care | Payer: BC Managed Care – PPO | Attending: Emergency Medicine | Admitting: Emergency Medicine

## 2020-10-14 ENCOUNTER — Emergency Department (HOSPITAL_COMMUNITY): Payer: BC Managed Care – PPO

## 2020-10-14 DIAGNOSIS — R079 Chest pain, unspecified: Secondary | ICD-10-CM

## 2020-10-14 DIAGNOSIS — M940 Chondrocostal junction syndrome [Tietze]: Secondary | ICD-10-CM | POA: Diagnosis not present

## 2020-10-14 DIAGNOSIS — R0789 Other chest pain: Secondary | ICD-10-CM | POA: Diagnosis present

## 2020-10-14 HISTORY — DX: Pure hypercholesterolemia, unspecified: E78.00

## 2020-10-14 HISTORY — DX: Anxiety disorder, unspecified: F41.9

## 2020-10-14 LAB — TROPONIN I (HIGH SENSITIVITY)
Troponin I (High Sensitivity): <2 ng/L (ref <18)
Troponin I (High Sensitivity): <2 ng/L (ref <18)

## 2020-10-14 LAB — BASIC METABOLIC PANEL
Anion gap: 6 (ref 5–15)
BUN: 10 mg/dL (ref 6–20)
CO2: 26 mmol/L (ref 22–32)
Calcium: 9.4 mg/dL (ref 8.9–10.3)
Chloride: 104 mmol/L (ref 98–111)
Creatinine, Ser: 0.79 mg/dL (ref 0.44–1.00)
GFR, Estimated: >60 mL/min (ref >60)
Glucose, Bld: 94 mg/dL (ref 70–99)
Potassium: 4 mmol/L (ref 3.5–5.1)
Sodium: 136 mmol/L (ref 135–145)

## 2020-10-14 LAB — CBC
HCT: 38.6 % (ref 36.0–46.0)
Hemoglobin: 12.2 g/dL (ref 12.0–15.0)
MCH: 27.4 pg (ref 26.0–34.0)
MCHC: 31.6 g/dL (ref 30.0–36.0)
MCV: 86.7 fL (ref 80.0–100.0)
Platelets: 352 10*3/uL (ref 150–400)
RBC: 4.45 MIL/uL (ref 3.87–5.11)
RDW: 15.2 % (ref 11.5–15.5)
WBC: 10.1 10*3/uL (ref 4.0–10.5)
nRBC: 0 % (ref 0.0–0.2)

## 2020-10-14 MED ORDER — IBUPROFEN 400 MG PO TABS
600.0000 mg | ORAL_TABLET | Freq: Once | ORAL | Status: AC
  Administered 2020-10-14: 600 mg via ORAL
  Filled 2020-10-14: qty 2

## 2020-10-14 MED ORDER — METHOCARBAMOL 500 MG PO TABS
500.0000 mg | ORAL_TABLET | Freq: Once | ORAL | Status: AC
Start: 2020-10-14 — End: 2020-10-14
  Administered 2020-10-14: 500 mg via ORAL
  Filled 2020-10-14: qty 1

## 2020-10-14 MED ORDER — METHOCARBAMOL 500 MG PO TABS: 500.0000 mg | ORAL_TABLET | Freq: Two times a day (BID) | ORAL | 0 refills | Status: AC

## 2020-10-14 MED ORDER — IBUPROFEN 600 MG PO TABS: 600.0000 mg | ORAL_TABLET | Freq: Four times a day (QID) | ORAL | 0 refills | Status: AC | PRN

## 2020-10-14 NOTE — ED Provider Notes (Signed)
[facility] EMERGENCY DEPARTMENT Provider Note   CSN: [id number] Arrival date & time: 10/14/20  0446   Time seen 5:30 AM  History Chief Complaint  Patient presents with  . Chest Pain    Carol Pacheco is a 50 y.o. female.  HPI   Patient states she was awakened from sleep about 3:30 AM with central chest pain that she describes as aching and pressure.  It does not radiate but she has a tingling sensation of her posterior head.  She feels short of breath and she got nauseated and sweaty but did not have vomiting.  She states she is never had this before.  She states getting upset or walking makes it worse, nothing makes it feel better.  She did take 4 baby aspirin at home and a chewable Pepcid.  She states she has chronic acid reflux but this is different.  Patient denies smoking history.  She was diagnosed with hypertension 4 years ago that was treated with Lexapro but no blood pressure medications.  She does have high cholesterol but no diabetes.  Family history of paternal uncle had open heart surgery and a paternal aunt has a pacemaker.  PCP Barbie Banner, MD   Past Medical History:  Diagnosis Date  . Anxiety   . Elevated cholesterol     There are no problems to display for this patient.   History reviewed. No pertinent surgical history.   OB History   No obstetric history on file.     Family History  Problem Relation Age of Onset  . Breast cancer Maternal Grandmother        72's     Social History   Tobacco Use  . Smoking status: Unknown If Ever Smoked  . Smokeless tobacco: Never Used  Substance Use Topics  . Alcohol use: Not Currently  . Drug use: Never    Home Medications Prior to Admission medications   Not on File    Allergies    Penicillins  Review of Systems   Review of Systems  All other systems reviewed and are negative.   Physical Exam Updated Vital Signs BP (!) 142/92   Pulse 72   Temp 97.8 F (36.6 C) (Oral)   Resp 17   Ht 5\' 2"   (1.575 m)   Wt 65.8 kg   SpO2 100%   BMI 26.52 kg/m   Physical Exam Vitals and nursing note reviewed.  Constitutional:      General: She is not in acute distress.    Appearance: Normal appearance. She is normal weight. She is not ill-appearing or toxic-appearing.  HENT:     Head: Normocephalic and atraumatic.     Right Ear: External ear normal.     Left Ear: External ear normal.     Mouth/Throat:     Mouth: Mucous membranes are moist.     Pharynx: No oropharyngeal exudate or posterior oropharyngeal erythema.  Eyes:     Extraocular Movements: Extraocular movements intact.     Conjunctiva/sclera: Conjunctivae normal.     Pupils: Pupils are equal, round, and reactive to light.  Cardiovascular:     Rate and Rhythm: Normal rate and regular rhythm.     Pulses: Normal pulses.     Heart sounds: Normal heart sounds.  Pulmonary:     Effort: Pulmonary effort is normal. No respiratory distress.     Breath sounds: Normal breath sounds.  Chest:     Chest wall: Tenderness present.    Musculoskeletal:  General: Normal range of motion.     Cervical back: Normal range of motion.  Skin:    General: Skin is warm and dry.  Neurological:     General: No focal deficit present.     Mental Status: She is alert and oriented to person, place, and time.     Cranial Nerves: No cranial nerve deficit.  Psychiatric:        Mood and Affect: Mood is anxious.        Speech: Speech normal.        Behavior: Behavior normal.     ED Results / Procedures / Treatments   Labs (all labs ordered are listed, but only abnormal results are displayed) Results for orders placed or performed during the hospital encounter of 10/14/20  Basic metabolic panel  Result Value Ref Range   Sodium 136 135 - 145 mmol/L   Potassium 4.0 3.5 - 5.1 mmol/L   Chloride 104 98 - 111 mmol/L   CO2 26 22 - 32 mmol/L   Glucose, Bld 94 70 - 99 mg/dL   BUN 10 6 - 20 mg/dL   Creatinine, Ser 7.25 0.44 - 1.00 mg/dL   Calcium  9.4 8.9 - 36.6 mg/dL   GFR, Estimated >44 >03 mL/min   Anion gap 6 5 - 15  CBC  Result Value Ref Range   WBC 10.1 4.0 - 10.5 K/uL   RBC 4.45 3.87 - 5.11 MIL/uL   Hemoglobin 12.2 12.0 - 15.0 g/dL   HCT 47.4 25.9 - 56.3 %   MCV 86.7 80.0 - 100.0 fL   MCH 27.4 26.0 - 34.0 pg   MCHC 31.6 30.0 - 36.0 g/dL   RDW 87.5 64.3 - 32.9 %   Platelets 352 150 - 400 K/uL   nRBC 0.0 0.0 - 0.2 %  Troponin I (High Sensitivity)  Result Value Ref Range   Troponin I (High Sensitivity) <2 <18 ng/L   Laboratory interpretation all normal    EKG EKG Interpretation  Date/Time:  Tuesday October 14 2020 04:59:02 EST Ventricular Rate:  74 PR Interval:    QRS Duration: 74 QT Interval:  402 QTC Calculation: 446 R Axis:   52 Text Interpretation: Sinus rhythm Borderline short PR interval Otherwise within normal limits No old tracing to compare Confirmed by Devoria Albe ([id number]) on 10/14/2020 5:07:41 AM   Radiology DG Chest Port 1 View  Result Date: 10/14/2020 CLINICAL DATA:  Midsternal chest pain. EXAM: PORTABLE CHEST 1 VIEW COMPARISON:  No prior. FINDINGS: Mediastinum and hilar structures normal. Low lung volumes. Lungs are clear. No focal infiltrate. No pleural effusion or pneumothorax. IMPRESSION: Low lung volumes.  No acute cardiopulmonary disease. Electronically Signed   By: Maisie Fus  Register   On: 10/14/2020 05:51    Procedures Procedures Medications Ordered in ED Medications  ibuprofen (ADVIL) tablet 600 mg (600 mg Oral Given 10/14/20 0558)  methocarbamol (ROBAXIN) tablet 500 mg (500 mg Oral Given 10/14/20 1660)    ED Course  I have reviewed the triage vital signs and the nursing notes.  Pertinent labs & imaging results that were available during my care of the patient were reviewed by me and considered in my medical decision making (see chart for details).    MDM Rules/Calculators/A&P                          On exam patient appears to have costochondritis or chest wall pain.  She was given  Motrin and  Robaxin for her pain while awaiting test results.  Patient does have some family history of heart disease and a history of high cholesterol otherwise she has no other risk factors.  She does report a lot of stress although she states nothing in particular happened recently.  Recheck at 7:20 AM patient relates her pain is improved.  We discussed that her test so far have been normal.  She is waiting for her delta troponin to be done.  Dr. Bernette Mayers introduced himself and will discharge her after her delta troponin has resulted.  Final Clinical Impression(s) / ED Diagnoses Final diagnoses:  Costochondritis    Rx / DC Orders  Disposition pending  Devoria Albe, MD, Concha Pyo, MD 10/14/20 [phone]

## 2020-10-14 NOTE — ED Provider Notes (Signed)
Care of the patient assumed at the change of shift. Here for atypical, reproducible chest wall pain onset during the night. Initial labs and imaging are normal. Awaiting delta trop. Symptoms improved with Robaxin.  Physical Exam  BP (!) 142/92   Pulse 72   Temp 97.8 F (36.6 C) (Oral)   Resp 17   Ht 5\' 2"  (1.575 m)   Wt 65.8 kg   SpO2 100%   BMI 26.52 kg/m   Physical Exam Awake alert No distress Respirations even and unlabored ED Course/Procedures   Clinical Course as of 10/14/20 0933  Tue Oct 14, 2020  0927 Second trop is still neg. Patient remains comfortable. Discharge home with Rx for NSAIDs, muscle relaxer and advised to rest.  [CS]    Clinical Course User Index [CS] Oct 16, 2020, MD    Procedures  MDM         Pollyann Savoy, MD 10/14/20 [phone]

## 2020-10-14 NOTE — ED Triage Notes (Signed)
Pt states she woke up around 3:30 this morning with mid-sternal chest pain and has tingling to both arms and tingling that travels down her neck to her back. Pt also states she is SOB.

## 2021-06-12 IMAGING — DX DG CHEST 1V PORT
1 series · 1 of 1 positions shown · non-contrast
Comparison: No prior.

CLINICAL DATA: Midsternal chest pain.

EXAM:
PORTABLE CHEST 1 VIEW

[chest ap]
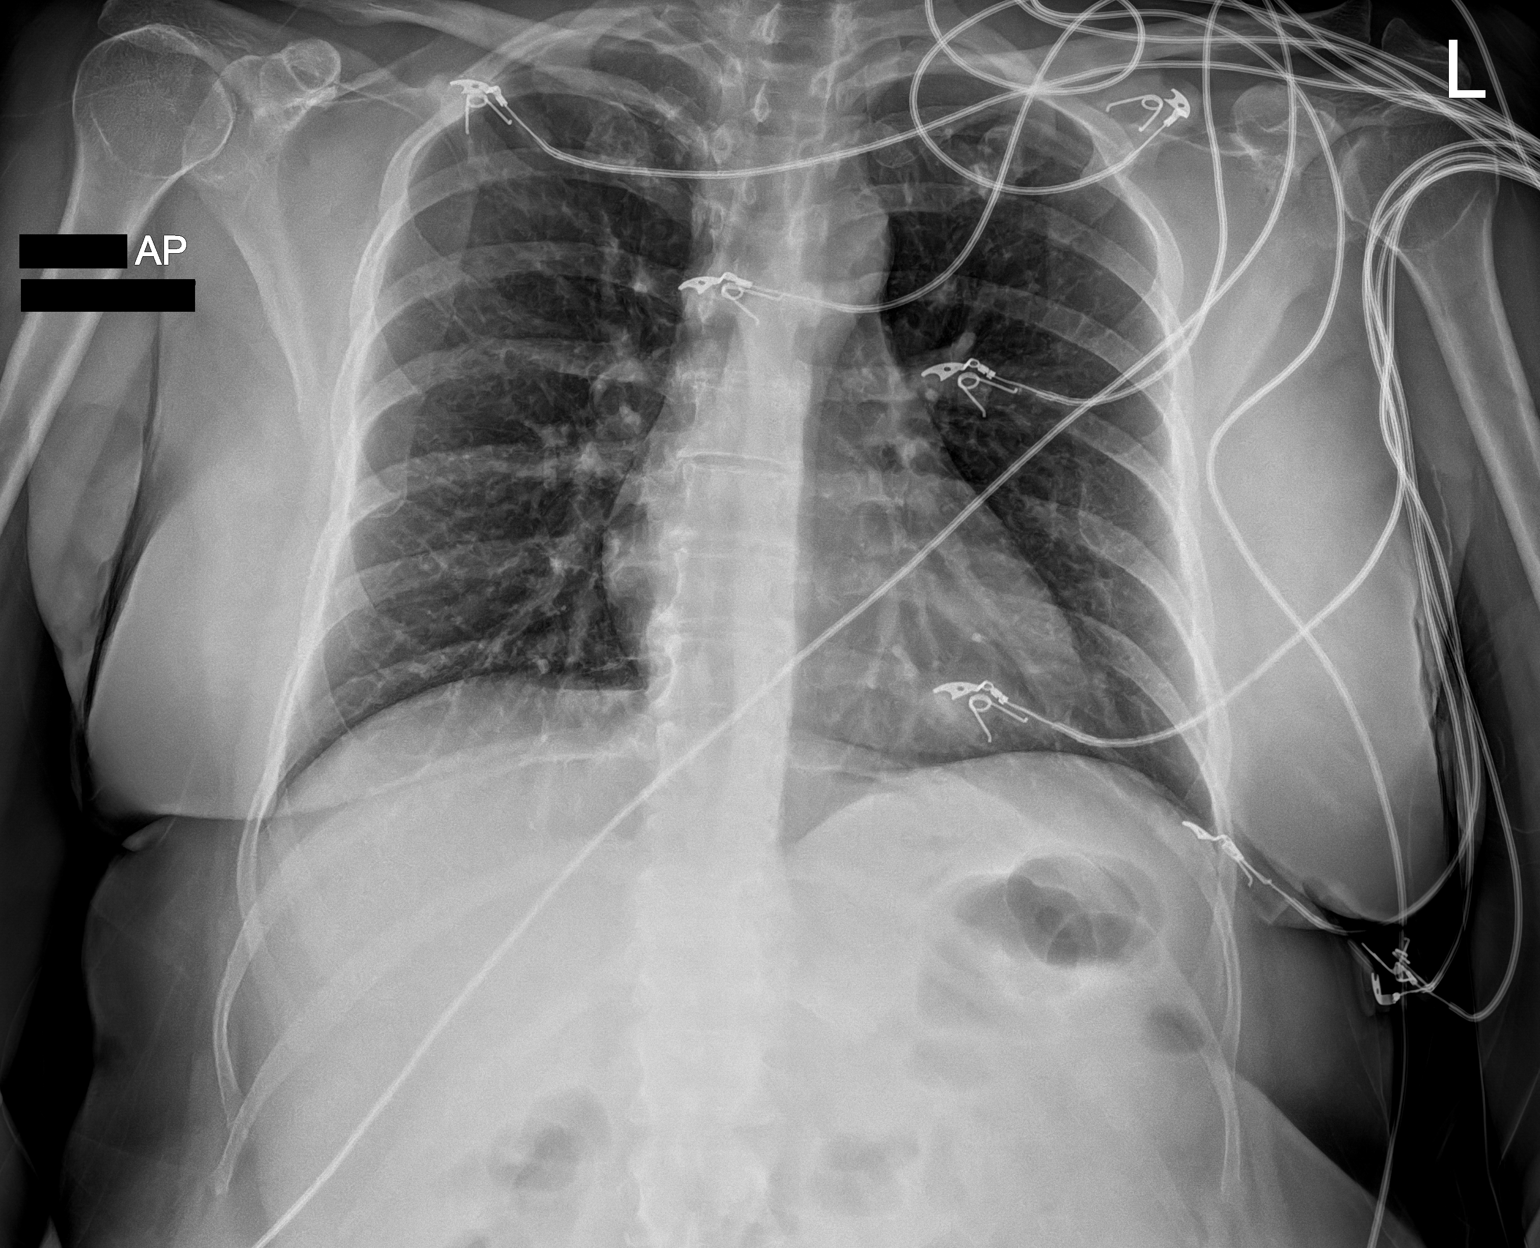

[1 of 1 positions shown; findings below may reference images not displayed]

FINDINGS: Mediastinum and hilar structures normal. Low lung volumes. Lungs are
clear. No focal infiltrate. No pleural effusion or pneumothorax.
IMPRESSION: Low lung volumes.  No acute cardiopulmonary disease.

## 2022-06-06 ENCOUNTER — Emergency Department (HOSPITAL_COMMUNITY): Payer: BC Managed Care – PPO

## 2022-06-06 ENCOUNTER — Other Ambulatory Visit: Payer: Self-pay

## 2022-06-06 ENCOUNTER — Encounter (HOSPITAL_COMMUNITY): Payer: Self-pay

## 2022-06-06 ENCOUNTER — Emergency Department (HOSPITAL_COMMUNITY)
Admission: EM | Admit: 2022-06-06 | Discharge: 2022-06-06 | Disposition: A | Discharge status: Home / Self Care | Payer: BC Managed Care – PPO | Attending: Emergency Medicine | Admitting: Emergency Medicine

## 2022-06-06 DIAGNOSIS — R079 Chest pain, unspecified: Secondary | ICD-10-CM | POA: Diagnosis present

## 2022-06-06 DIAGNOSIS — R202 Paresthesia of skin: Secondary | ICD-10-CM | POA: Diagnosis not present

## 2022-06-06 DIAGNOSIS — I10 Essential (primary) hypertension: Secondary | ICD-10-CM | POA: Diagnosis not present

## 2022-06-06 DIAGNOSIS — R42 Dizziness and giddiness: Secondary | ICD-10-CM

## 2022-06-06 DIAGNOSIS — F419 Anxiety disorder, unspecified: Secondary | ICD-10-CM | POA: Diagnosis not present

## 2022-06-06 DIAGNOSIS — F411 Generalized anxiety disorder: Secondary | ICD-10-CM

## 2022-06-06 HISTORY — DX: Other intervertebral disc displacement, lumbar region: M51.26

## 2022-06-06 HISTORY — DX: Gastro-esophageal reflux disease without esophagitis: K21.9

## 2022-06-06 LAB — BASIC METABOLIC PANEL
Anion gap: 8 (ref 5–15)
BUN: 7 mg/dL (ref 6–20)
CO2: 25 mmol/L (ref 22–32)
Calcium: 8.8 mg/dL — ABNORMAL LOW (ref 8.9–10.3)
Chloride: 105 mmol/L (ref 98–111)
Creatinine, Ser: 0.86 mg/dL (ref 0.44–1.00)
GFR, Estimated: >60 mL/min (ref >60)
Glucose, Bld: 91 mg/dL (ref 70–99)
Potassium: 3.4 mmol/L — ABNORMAL LOW (ref 3.5–5.1)
Sodium: 138 mmol/L (ref 135–145)

## 2022-06-06 LAB — CBC WITH DIFFERENTIAL/PLATELET
Abs Immature Granulocytes: 0.04 10*3/uL (ref 0.00–0.07)
Basophils Absolute: 0.1 10*3/uL (ref 0.0–0.1)
Basophils Relative: 1 %
Eosinophils Absolute: 0.2 10*3/uL (ref 0.0–0.5)
Eosinophils Relative: 2 %
HCT: 37.5 % (ref 36.0–46.0)
Hemoglobin: 11.6 g/dL — ABNORMAL LOW (ref 12.0–15.0)
Immature Granulocytes: 0 %
Lymphocytes Relative: 35 %
Lymphs Abs: 3.8 10*3/uL (ref 0.7–4.0)
MCH: 26.4 pg (ref 26.0–34.0)
MCHC: 30.9 g/dL (ref 30.0–36.0)
MCV: 85.4 fL (ref 80.0–100.0)
Monocytes Absolute: 0.7 10*3/uL (ref 0.1–1.0)
Monocytes Relative: 6 %
Neutro Abs: 6.2 10*3/uL (ref 1.7–7.7)
Neutrophils Relative %: 56 %
Platelets: 315 10*3/uL (ref 150–400)
RBC: 4.39 MIL/uL (ref 3.87–5.11)
RDW: 15.2 % (ref 11.5–15.5)
WBC: 10.9 10*3/uL — ABNORMAL HIGH (ref 4.0–10.5)
nRBC: 0 % (ref 0.0–0.2)

## 2022-06-06 LAB — TROPONIN I (HIGH SENSITIVITY)
Troponin I (High Sensitivity): <2 ng/L (ref <18)
Troponin I (High Sensitivity): <2 ng/L (ref <18)

## 2022-06-06 MED ORDER — HYDROXYZINE HCL 25 MG PO TABS
25.0000 mg | ORAL_TABLET | Freq: Once | ORAL | Status: AC
  Administered 2022-06-06: 25 mg via ORAL
  Filled 2022-06-06: qty 1

## 2022-06-06 MED ORDER — HYDROXYZINE HCL 10 MG PO TABS: 25.0000 mg | ORAL_TABLET | Freq: Four times a day (QID) | ORAL | 0 refills | Status: AC | PRN

## 2022-06-06 MED ORDER — HYDRALAZINE HCL 20 MG/ML IJ SOLN: 10.0000 mg | Freq: Once | INTRAMUSCULAR | Status: DC

## 2022-06-06 NOTE — Discharge Instructions (Addendum)
Please follow-up with your primary care doctor in regards to blood pressure and anxiety.  Consider taking BuSpar.  You have been prescribed a temporary dose of hydroxyzine for anxiety.  If your chest pain, shortness of breath, paresthesias worsen, you develop any one-sided weakness, facial droop please return to the ER immediately.  Please document your blood pressure twice a day for your primary care doctor.

## 2022-06-06 NOTE — ED Provider Notes (Signed)
[facility] EMERGENCY DEPARTMENT Provider Note   CSN: [id number] Arrival date & time: 06/06/22  1929     History  Chief Complaint  Patient presents with   Hypertension    Carol Pacheco is a 51 y.o. female, hx of GERD, anxiety, who presents to the ED 2/2 to intermittent paresthesias, chest pain for the last week.  She states that she has been extremely stressed this week, and that her blood pressure has been elevated, running around SBPs 130-150, DBPs 80-100.  She states she only takes metoprolol, and this was after a bout of chest pain last year that was very similar.  She describes the chest pain as dull and is improved with pressure, states that with the chest pain she also gets paresthesias that are going up the back of her neck, and her shoulders in both her arms.  Denies paresthesias on one side of her body.  Denies any chest pain or shortness of breath right now.  Is concerned about her blood pressure.  No vision changes, headache, nausea, vomiting, diaphoresis.  States that she has been under tremendous amount of stress, this week and that symptoms have gotten worse with the stress increase.  States last time that this happened, she was placed on Lexapro and metoprolol.  Hypertension Associated symptoms include chest pain. Pertinent negatives include no shortness of breath.      Home Medications Prior to Admission medications   Medication Sig Start Date End Date Taking? Authorizing Provider  hydrOXYzine (ATARAX) 10 MG tablet Take 2.5 tablets (25 mg total) by mouth every 6 (six) hours as needed for anxiety. 06/06/22  Yes Aryona Sill L, PA  cetirizine (ZYRTEC) 10 MG tablet Take 10 mg by mouth daily.    [provider]  escitalopram (LEXAPRO) 20 MG tablet Take 1 tablet by mouth daily. 03/07/17   [provider]  ibuprofen (ADVIL) 600 MG tablet Take 1 tablet (600 mg total) by mouth every 6 (six) hours as needed. 10/14/20   Pollyann Savoy, MD  methocarbamol  (ROBAXIN) 500 MG tablet Take 1 tablet (500 mg total) by mouth 2 (two) times daily. 10/14/20   Pollyann Savoy, MD  Multiple Vitamin (MULTIVITAMINS PO) Take 1 tablet by mouth daily.    [provider]  Omega-3 1000 MG CAPS Take 1 capsule by mouth daily.    [provider]  rosuvastatin (CRESTOR) 10 MG tablet Take 10 mg by mouth daily. 07/25/20   [provider]  VITAMIN E PO Take 1 tablet by mouth daily.    [provider]      Allergies    Penicillins    Review of Systems   Review of Systems  Respiratory:  Positive for chest tightness. Negative for shortness of breath.   Cardiovascular:  Positive for chest pain.    Physical Exam Updated Vital Signs BP (!) 148/87   Pulse 63   Temp 98.3 F (36.8 C) (Oral)   Resp 17   Ht 5\' 2"  (1.575 m)   Wt 71.7 kg   SpO2 99%   BMI 28.90 kg/m  Physical Exam Vitals and nursing note reviewed.  Constitutional:      General: She is not in acute distress.    Appearance: She is well-developed.  HENT:     Head: Normocephalic and atraumatic.  Eyes:     Conjunctiva/sclera: Conjunctivae normal.  Cardiovascular:     Rate and Rhythm: Normal rate and regular rhythm.     Heart sounds: No  murmur heard. Pulmonary:     Effort: Pulmonary effort is normal. No respiratory distress.     Breath sounds: Normal breath sounds.  Abdominal:     Palpations: Abdomen is soft.     Tenderness: There is no abdominal tenderness.  Musculoskeletal:        General: No swelling.     Cervical back: Neck supple.  Skin:    General: Skin is warm and dry.     Capillary Refill: Capillary refill takes less than 2 seconds.  Neurological:     General: No focal deficit present.     Mental Status: She is alert and oriented to person, place, and time.     Cranial Nerves: No cranial nerve deficit.     Sensory: No sensory deficit.     Motor: No weakness.     Coordination: Coordination normal.  Psychiatric:        Mood and Affect: Mood  normal.     ED Results / Procedures / Treatments   Labs (all labs ordered are listed, but only abnormal results are displayed) Labs Reviewed  CBC WITH DIFFERENTIAL/PLATELET - Abnormal; Notable for the following components:      Result Value   WBC 10.9 (*)    Hemoglobin 11.6 (*)    All other components within normal limits  BASIC METABOLIC PANEL - Abnormal; Notable for the following components:   Potassium 3.4 (*)    Calcium 8.8 (*)    All other components within normal limits  TROPONIN I (HIGH SENSITIVITY)  TROPONIN I (HIGH SENSITIVITY)    EKG EKG Interpretation  Date/Time:  Sunday June 06 2022 22:49:29 EDT Ventricular Rate:  68 PR Interval:  114 QRS Duration: 86 QT Interval:  433 QTC Calculation: 461 R Axis:   34 Text Interpretation: Sinus rhythm Borderline short PR interval Low voltage, precordial leads Confirmed by Alona Bene [phone]) on 06/06/2022 10:50:44 PM-  Radiology DG Chest 2 View  Result Date: 06/06/2022 CLINICAL DATA:  Chest pain EXAM: CHEST - 2 VIEW COMPARISON:  Radiographs 10/14/2020 FINDINGS: No focal consolidation, pleural effusion, or pneumothorax. Normal cardiomediastinal silhouette. No acute osseous abnormality. IMPRESSION: No active cardiopulmonary disease. Electronically Signed   By: Minerva Fester M.D.   On: 06/06/2022 20:59    Procedures Procedures   Medications Ordered in ED Medications  hydrOXYzine (ATARAX) tablet 25 mg (25 mg Oral Given 06/06/22 2316)    ED Course/ Medical Decision Making/ A&P                           Medical Decision Making Amount and/or Complexity of Data Reviewed Labs: ordered. Radiology: ordered.  Risk Prescription drug management.   This patient presents to the ED for concern of chest pain, paresthesias   Co morbidities that complicate the patient evaluation  Anxiety, GERD   Lab Tests:  I Ordered, and personally interpreted labs.  The pertinent results include:  troponins <3 x2   Imaging  Studies ordered:  I ordered imaging studies including chest xray  imaging which showed no acute findings   Cardiac Monitoring: / EKG:  The patient was maintained on a cardiac monitor.  I personally viewed and interpreted the cardiac monitored which showed an underlying rhythm of: sinus rhythm   Test / Admission - Considered:  Patient is a 51 year old female, well-appearing, has no acute neurodeficits on exam, who presents for chest pain, paresthesias.  She is not currently having any of this.  Her troponins were less  than 3 twice, and she had a normal sinus rhythm, on her EKG, heart is 2, advised on need for follow-up with primary care provider.  Her chest pain and paresthesias occurred around the time of her high anxiety.,  She states this happened last year, advised follow-up with primary care provider, and likely starting BuSpar.  Will give hydroxyzine for anxiety, until she is able to see primary care doctor.  Return precautions emphasized. Final Clinical Impression(s) / ED Diagnoses Final diagnoses:  Chest pain, unspecified type  Anxiety state  Dizziness    Rx / DC Orders ED Discharge Orders          Ordered    hydrOXYzine (ATARAX) 10 MG tablet  Every 6 hours PRN        06/06/22 2312              Andreea Arca, [place], PA 06/07/22 0327    Margette Fast, MD 06/08/22 1103

## 2022-06-06 NOTE — ED Triage Notes (Signed)
Pt arrived via POV from home c/o hypertension at home. Pt has been keeping a log of BP readings at home to submit to her PCP for screening of hypertension. Pt does endorse intermittent chest pain, dizziness, body tingling sensations, blurry vision and headaches.

## 2024-09-10 ENCOUNTER — Other Ambulatory Visit: Payer: Self-pay | Admitting: Physician Assistant

## 2024-09-10 DIAGNOSIS — Z1231 Encounter for screening mammogram for malignant neoplasm of breast: Secondary | ICD-10-CM

## 2024-09-11 ENCOUNTER — Inpatient Hospital Stay
Admission: RE | Admit: 2024-09-11 | Discharge: 2024-09-11 | Payer: Self-pay | Attending: Physician Assistant | Admitting: Physician Assistant

## 2024-09-11 DIAGNOSIS — Z1231 Encounter for screening mammogram for malignant neoplasm of breast: Secondary | ICD-10-CM
